# Patient Record
Sex: Male | Born: 1955 | Race: White | Hispanic: No | Marital: Married | Smoking: Former smoker
Health system: Southern US, Community
[De-identification: ages and names within clinical notes are randomized; demographics above are authoritative.]

## PROBLEM LIST (undated history)

## (undated) DIAGNOSIS — I10 Essential (primary) hypertension: Secondary | ICD-10-CM

## (undated) HISTORY — PX: WISDOM TOOTH EXTRACTION: SHX21

## (undated) HISTORY — PX: KNEE ARTHROSCOPY: SHX127

## (undated) HISTORY — DX: Essential (primary) hypertension: I10

---

## 2000-07-30 ENCOUNTER — Ambulatory Visit (HOSPITAL_BASED_OUTPATIENT_CLINIC_OR_DEPARTMENT_OTHER): Admission: RE | Admit: 2000-07-30 | Discharge: 2000-07-30 | Payer: Self-pay | Admitting: *Deleted

## 2001-02-05 ENCOUNTER — Emergency Department (HOSPITAL_COMMUNITY): Admission: EM | Admit: 2001-02-05 | Discharge: 2001-02-05 | Payer: Self-pay | Admitting: Emergency Medicine

## 2003-03-05 ENCOUNTER — Emergency Department (HOSPITAL_COMMUNITY): Admission: EM | Admit: 2003-03-05 | Discharge: 2003-03-06 | Payer: Self-pay | Admitting: Emergency Medicine

## 2004-09-11 ENCOUNTER — Encounter: Admission: RE | Admit: 2004-09-11 | Discharge: 2004-09-11 | Payer: Self-pay | Admitting: Internal Medicine

## 2006-04-08 ENCOUNTER — Ambulatory Visit (HOSPITAL_BASED_OUTPATIENT_CLINIC_OR_DEPARTMENT_OTHER): Admission: RE | Admit: 2006-04-08 | Discharge: 2006-04-08 | Payer: Self-pay | Admitting: Internal Medicine

## 2006-04-10 ENCOUNTER — Ambulatory Visit: Payer: Self-pay | Admitting: Internal Medicine

## 2006-05-13 ENCOUNTER — Ambulatory Visit: Payer: Self-pay | Admitting: Pulmonary Disease

## 2006-06-27 ENCOUNTER — Ambulatory Visit: Payer: Self-pay | Admitting: Pulmonary Disease

## 2011-05-04 ENCOUNTER — Emergency Department (HOSPITAL_COMMUNITY)
Admission: EM | Admit: 2011-05-04 | Discharge: 2011-05-05 | Disposition: A | Discharge status: Home / Self Care | Payer: Managed Care, Other (non HMO) | Attending: Emergency Medicine | Admitting: Emergency Medicine

## 2011-05-04 DIAGNOSIS — W219XXA Striking against or struck by unspecified sports equipment, initial encounter: Secondary | ICD-10-CM | POA: Insufficient documentation

## 2011-05-04 DIAGNOSIS — S01501A Unspecified open wound of lip, initial encounter: Secondary | ICD-10-CM | POA: Insufficient documentation

## 2011-05-04 DIAGNOSIS — I10 Essential (primary) hypertension: Secondary | ICD-10-CM | POA: Insufficient documentation

## 2013-07-17 ENCOUNTER — Ambulatory Visit (INDEPENDENT_AMBULATORY_CARE_PROVIDER_SITE_OTHER): Payer: Managed Care, Other (non HMO)

## 2013-07-17 ENCOUNTER — Encounter: Payer: Self-pay | Admitting: Podiatry

## 2013-07-17 ENCOUNTER — Ambulatory Visit (INDEPENDENT_AMBULATORY_CARE_PROVIDER_SITE_OTHER): Payer: Managed Care, Other (non HMO) | Admitting: Podiatry

## 2013-07-17 VITALS — BP 122/74 | HR 75 | Resp 16 | Ht 72.0 in | Wt 280.0 lb

## 2013-07-17 DIAGNOSIS — M79672 Pain in left foot: Secondary | ICD-10-CM

## 2013-07-17 DIAGNOSIS — M79609 Pain in unspecified limb: Secondary | ICD-10-CM

## 2013-07-17 DIAGNOSIS — M7750 Other enthesopathy of unspecified foot: Secondary | ICD-10-CM

## 2013-07-17 NOTE — Progress Notes (Signed)
  Subjective:    Patient ID: Jeffery Russell, male    DOB: June 13, 1956, 57 y.o.   MRN: [id number]  HPI Comments: "I've got this callus starting I believe from these shoes and also the side of this foot hurts"  Plantar 5th MPJ right and Lateral side right  N - aches L - sub 5th right/lateral right D - 3-4 mos. O - gradual C - callused area, sometimes lateral is swollen, worse, felt a pop last week when stepping A - walking, certain shoes T - epsom salt soaks, advil, changing shoes       Review of Systems  Psychiatric/Behavioral: Positive for sleep disturbance.       Sleep apnea/CPAP  All other systems reviewed and are negative.       Objective:   Physical Exam: I have evaluated his past medical history medications and allergies. Vital signs are stable he is alert and oriented x3. Pulses are strongly palpable bilateral lower extremity. Neurologic sensorium is intact per Semmes-Weinstein monofilament. Deep tendon reflexes are intact bilateral. Muscle strength +5 over 5 dorsiflexors plantar flexors inverters and evertors. Orthopedic evaluation demonstrates pain on palpation of the fourth and fifth metatarsocuboid articulation. There is no pain on medial lateral compression of the calcaneus or palpation of the medial calcaneal tubercle. Radiographic evaluation does not demonstrate any type of osseous abnormalities. Other than mild pes planus.        Assessment & Plan:  Impression: Capsulitis fourth fifth met cuboid articulation left. Pes planus left.  Plan: Discussed the etiology pathology conservative versus surgical therapies. Injected him today with dexamethasone and local anesthetic at the point of maximal tenderness dorsal lateral. Discussed the possible need for orthotics. I will followup with him in one month.

## 2013-08-21 ENCOUNTER — Encounter: Payer: Self-pay | Admitting: Podiatry

## 2013-08-21 ENCOUNTER — Ambulatory Visit (INDEPENDENT_AMBULATORY_CARE_PROVIDER_SITE_OTHER): Payer: Managed Care, Other (non HMO) | Admitting: Podiatry

## 2013-08-21 VITALS — BP 132/83 | HR 74 | Resp 16

## 2013-08-21 DIAGNOSIS — M7750 Other enthesopathy of unspecified foot: Secondary | ICD-10-CM

## 2013-08-21 NOTE — Progress Notes (Signed)
Mr. Loescher presents today for followup of his pain to the fourth intermetatarsal space of the left foot. He states it is approximately 75% better.  Objective: Vital signs are stable he is alert and oriented x3. Pulses are strongly palpable left. Residual tenderness to the proximal fourth intermetatarsal space left foot.  Assessment: Capsulitis metatarsalgia fourth intermetatarsal space left.  Plan: Injected Kenalog and local anesthetic to a today 20 mg point of maximal tenderness. I will followup with him in one month. We'll consider orthotics at that time.

## 2013-09-18 ENCOUNTER — Ambulatory Visit: Payer: Managed Care, Other (non HMO) | Admitting: Podiatry

## 2017-06-06 ENCOUNTER — Other Ambulatory Visit: Payer: Self-pay | Admitting: Internal Medicine

## 2017-06-06 DIAGNOSIS — R2243 Localized swelling, mass and lump, lower limb, bilateral: Secondary | ICD-10-CM

## 2017-06-08 ENCOUNTER — Ambulatory Visit
Admission: RE | Admit: 2017-06-08 | Discharge: 2017-06-08 | Disposition: A | Discharge status: Home / Self Care | Payer: Managed Care, Other (non HMO) | Source: Ambulatory Visit | Attending: Internal Medicine | Admitting: Internal Medicine

## 2017-06-08 ENCOUNTER — Other Ambulatory Visit: Payer: Self-pay | Admitting: Internal Medicine

## 2017-06-08 DIAGNOSIS — R2243 Localized swelling, mass and lump, lower limb, bilateral: Secondary | ICD-10-CM

## 2017-06-08 NOTE — Consult Note (Signed)
Chief Complaint: Patient was seen in consultation today for No chief complaint on file.  at the request of Husain,Karrar  Referring Physician(s): Husain,Karrar  History of Present Illness: Jeffery Russell is a 61 y.o. male with a long history of bilateral ankle edema. He denies any history of skin breakdown. He does stand for long present time during work. He has not had any therapy. He is not on diuretics. He denies visible varicosities. His family history is positive with venous insufficiency from his mother. He denies any lower extremity pain.  Past Medical History:  Diagnosis Date  . Hypertension     Past Surgical History:  Procedure Laterality Date  . KNEE ARTHROSCOPY    . WISDOM TOOTH EXTRACTION      Allergies: Patient has no known allergies.  Medications: Prior to Admission medications   Medication Sig Start Date End Date Taking? Authorizing Provider  lisinopril (PRINIVIL,ZESTRIL) 5 MG tablet Take 5 mg by mouth daily.    [provider]     No family history on file.  Social History   Social History  . Marital status: Married    Spouse name: N/A  . Number of children: N/A  . Years of education: N/A   Social History Main Topics  . Smoking status: Former Research scientist (life sciences)  . Smokeless tobacco: Not on file  . Alcohol use Yes     Comment: occas  . Drug use: Unknown  . Sexual activity: Not on file   Other Topics Concern  . Not on file   Social History Narrative  . No narrative on file      Review of Systems: A 12 point ROS discussed and pertinent positives are indicated in the HPI above.  All other systems are negative.  Review of Systems  Vital Signs: There were no vitals taken for this visit.  Physical Exam  Constitutional: He is oriented to person, place, and time. He appears well-developed and well-nourished.  HENT:  Head: Normocephalic and atraumatic.  Musculoskeletal:  There is no evidence of right lower extremity edema. No evidence  of skin breakdown in the right leg. Pulses are intact distally. Examination of the left lower extremity demonstrates 2+ pitting edema. Pulses are intact distally. No varicosities. No skin breakdown.  Neurological: He is alert and oriented to person, place, and time.     Imaging: US Venous Img Lower Bilateral  Result Date: 06/08/2017 CLINICAL DATA:  Lower extremity edema right greater than left EXAM: BILATERAL LOWER EXTREMITY VENOUS DUPLEX ULTRASOUND TECHNIQUE: Gray-scale sonography with graded compression, as well as color Doppler and duplex ultrasound, were performed to evaluate the deep and superficial veins of both lower extremities. Spectral Doppler was utilized to evaluate flow at rest and with distal augmentation maneuvers. A complete superficial venous insufficiency exam was performed in the upright. I personally performed the technical portion of the exam. COMPARISON:  None. FINDINGS: There is complete compressibility of the bilateral common femoral, femoral, and popliteal veins. Doppler analysis demonstrates respiratory phasicity and augmentation of flow with calf compression. There is no evidence of reflux in the right or left greater or lesser saphenous veins. There is partial thrombosis of the right lesser saphenous vein. IMPRESSION: No evidence of DVT. No evidence of venous insufficiency. Partial superficial vein thrombosis in the right lesser saphenous vein. Electronically Signed   By: Marybelle Killings M.D.   On: 06/08/2017 15:18    Labs:  CBC: No results for input(s): WBC, HGB, HCT, PLT in the last 8760 hours.  COAGS: No results for input(s): INR, APTT in the last 8760 hours.  BMP: No results for input(s): NA, K, CL, CO2, GLUCOSE, BUN, CALCIUM, CREATININE, GFRNONAA, GFRAA in the last 8760 hours.  Invalid input(s): CMP  LIVER FUNCTION TESTS: No results for input(s): BILITOT, AST, ALT, ALKPHOS, PROT, ALBUMIN in the last 8760 hours.  TUMOR MARKERS: No results for input(s): AFPTM,  CEA, CA199, CHROMGRNA in the last 8760 hours.  Assessment and Plan:  Mr. Marsala does have significant unilateral edema at the left ankle but no evidence of venous insufficiency. There is partial thrombosis of the right lesser saphenous vein but this is clinically occult and should resolve spontaneously. Anticoagulation is not recommended for superficial vein thrombosis. Graded compression stockings were administered for his ankle edema.  Thank you for this interesting consult.  I greatly enjoyed meeting TOUSSAINT GOLSON and look forward to participating in their care.  A copy of this report was sent to the requesting provider on this date.  Electronically Signed: Mylon Mabey, ART A 06/08/2017, 3:21 PM   I spent a total of  30 Minutes   in face to face in clinical consultation, greater than 50% of which was counseling/coordinating care for lower extremity edema.

## 2017-12-30 IMAGING — US US EXTREM LOW VENOUS BILAT
1 series · 14 of 24 positions shown · non-contrast
Comparison: None.

CLINICAL DATA: Lower extremity edema right greater than left

EXAM:
BILATERAL LOWER EXTREMITY VENOUS DUPLEX ULTRASOUND
TECHNIQUE: Gray-scale sonography with graded compression, as well as color
Doppler and duplex ultrasound, were performed to evaluate the deep
and superficial veins of both lower extremities. Spectral Doppler
was utilized to evaluate flow at rest and with distal augmentation
maneuvers. A complete superficial venous insufficiency exam was
performed in the upright. I personally performed the technical
portion of the exam.

[Series 1: us extrem low venous bilat · 0.10mm/px · 14 of 52 slices shown]
[im 1/52]
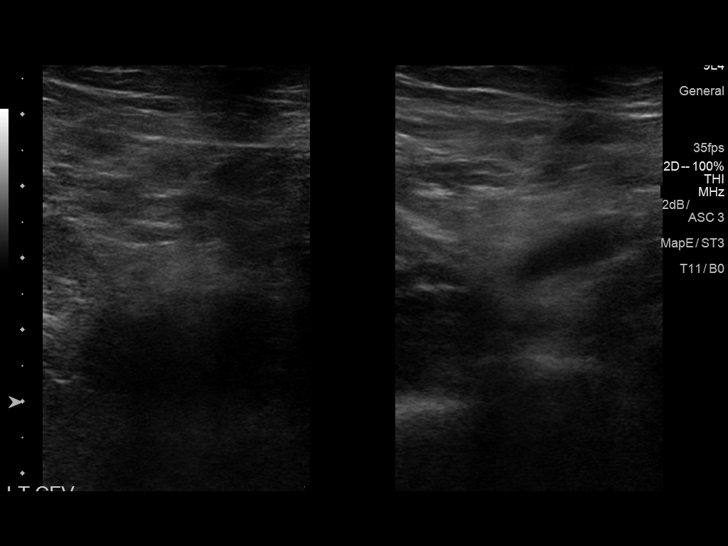
[im 5/52]
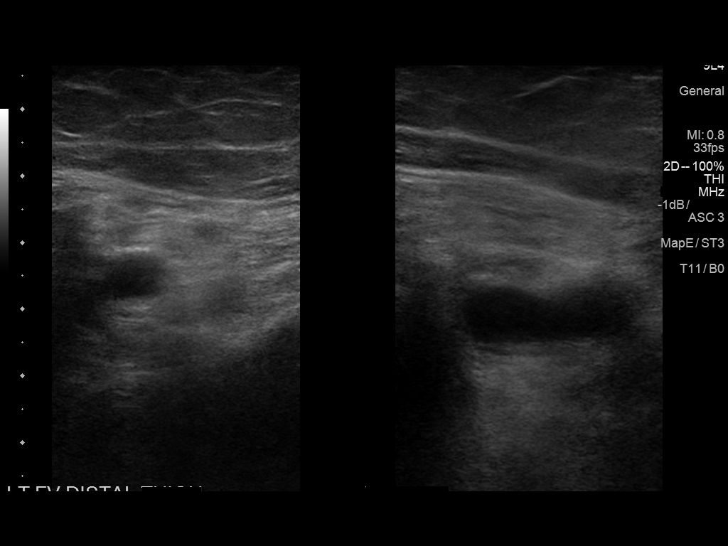
[im 9/52]
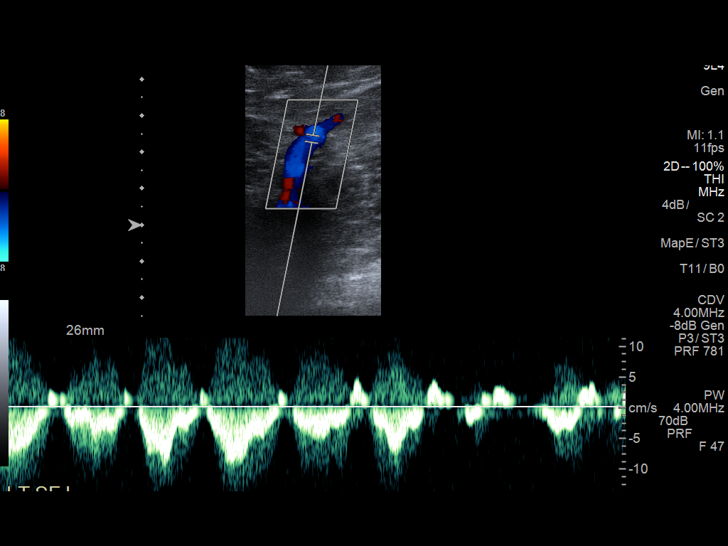
[im 14/52]
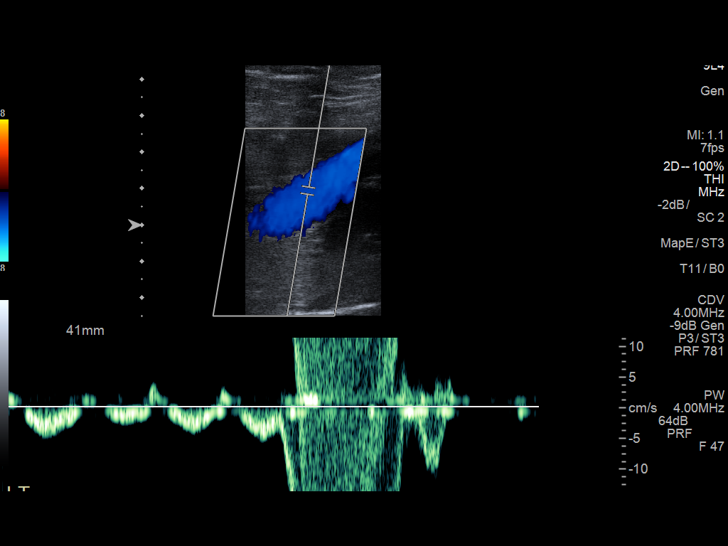
[im 16/52]
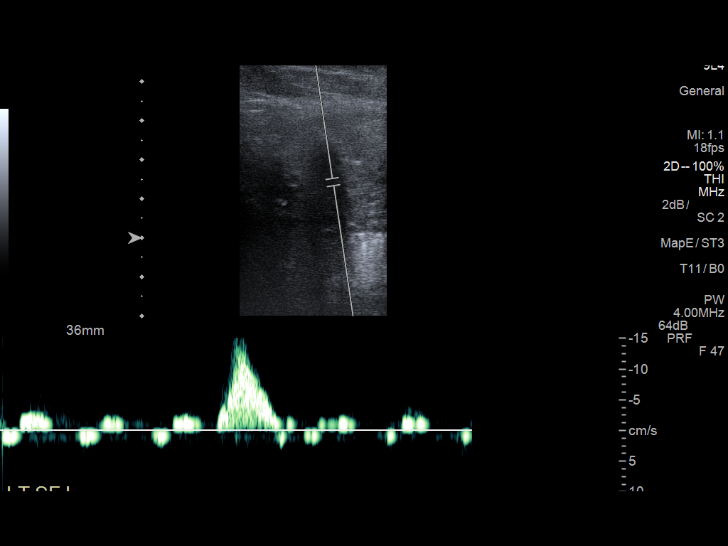
[im 20/52]
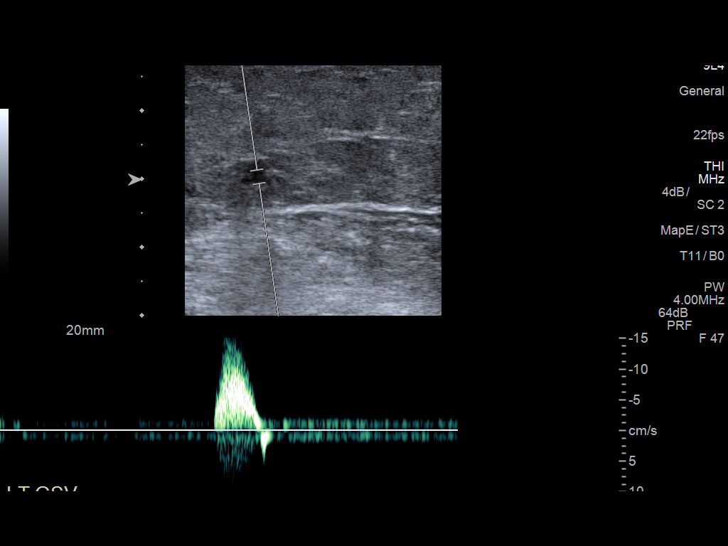
[im 25/52]
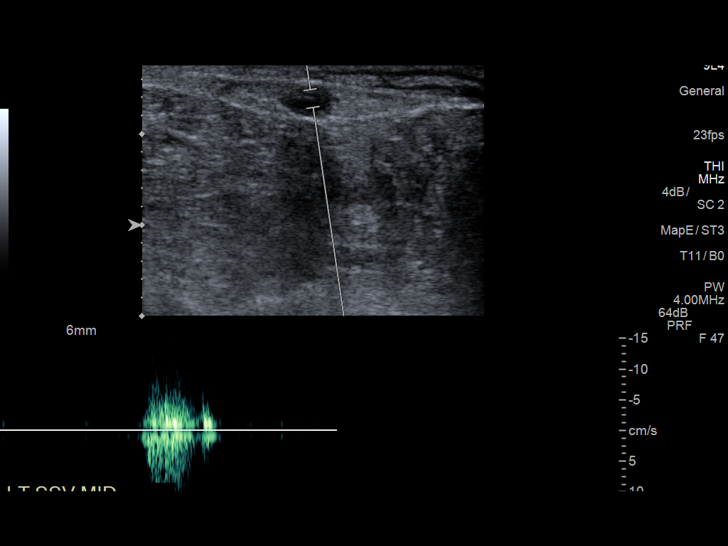
[im 27/52]
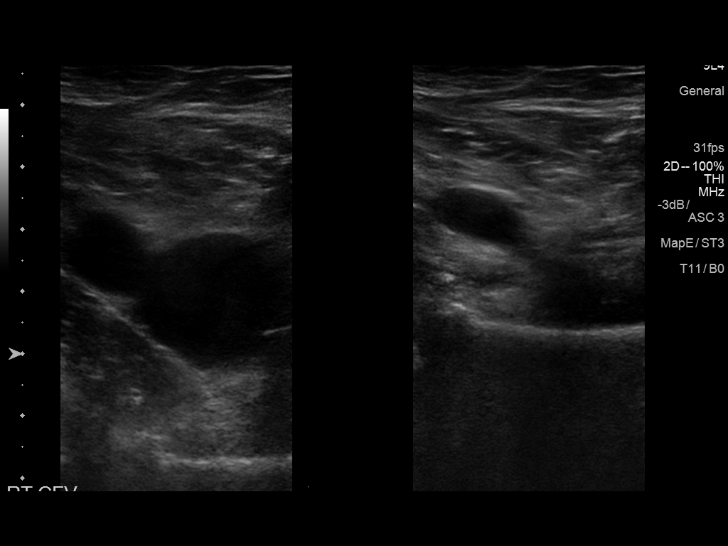
[im 32/52]
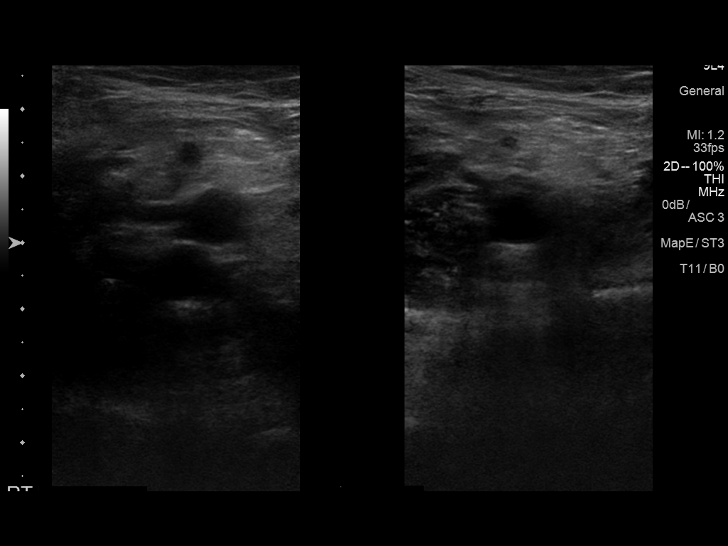
[im 36/52]
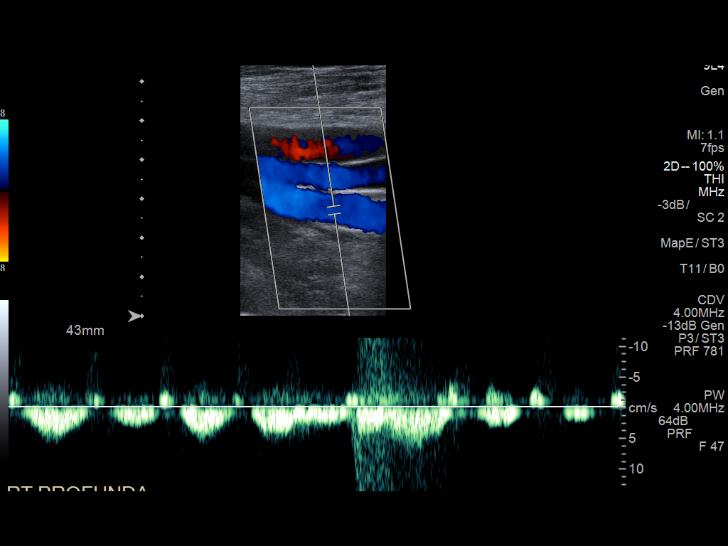
[im 40/52]
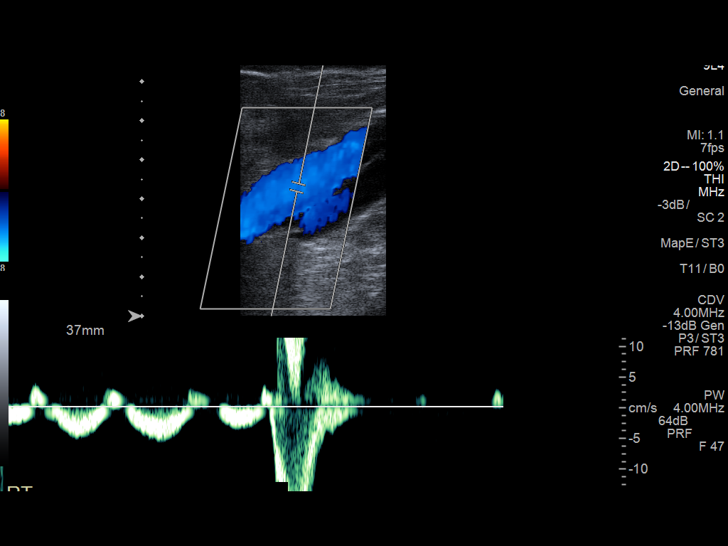
[im 43/52]
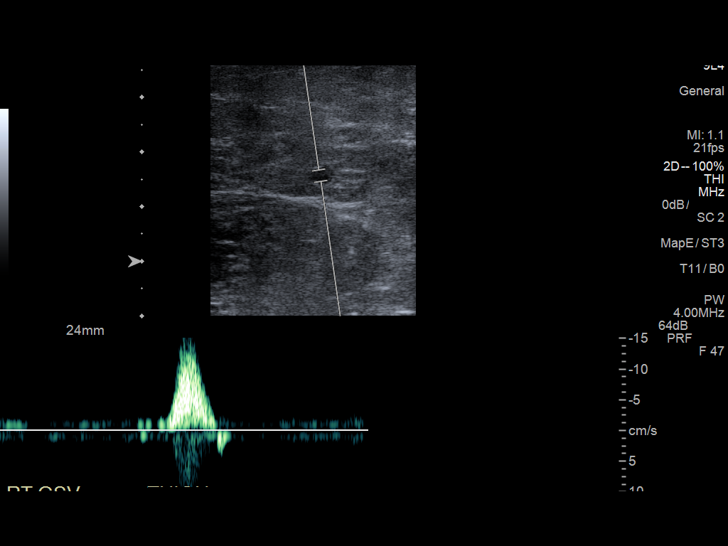
[im 47/52]
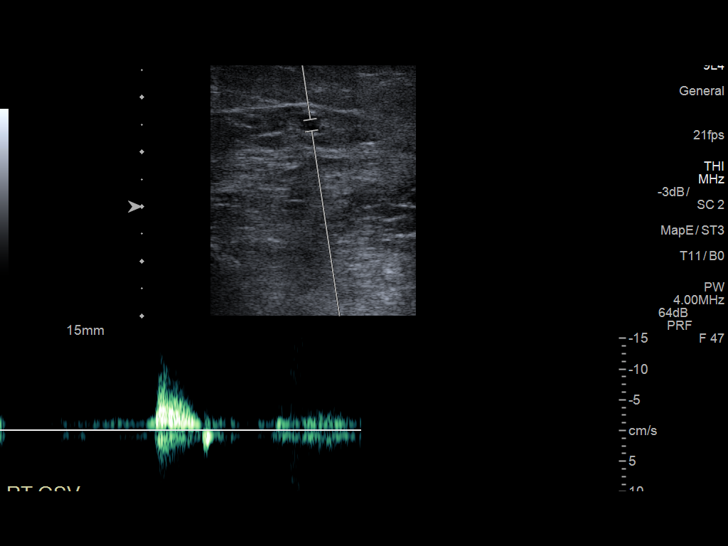
[im 52/52]
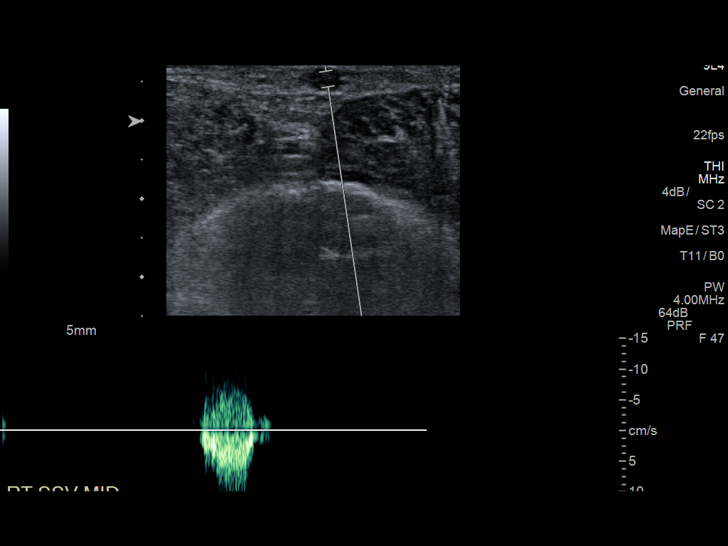

[14 of 24 positions shown; findings below may reference images not displayed]

FINDINGS: There is complete compressibility of the bilateral common femoral,
femoral, and popliteal veins. Doppler analysis demonstrates
respiratory phasicity and augmentation of flow with calf
compression.

There is no evidence of reflux in the right or left greater or
lesser saphenous veins. There is partial thrombosis of the right
lesser saphenous vein.
IMPRESSION: No evidence of DVT.

No evidence of venous insufficiency.

Partial superficial vein thrombosis in the right lesser saphenous
vein.

## 2018-09-26 ENCOUNTER — Ambulatory Visit: Payer: Self-pay | Admitting: Nurse Practitioner

## 2018-09-26 VITALS — BP 125/75 | HR 74 | Temp 98.6°F | Resp 18 | Wt 302.2 lb

## 2018-09-26 DIAGNOSIS — B9789 Other viral agents as the cause of diseases classified elsewhere: Secondary | ICD-10-CM

## 2018-09-26 DIAGNOSIS — J329 Chronic sinusitis, unspecified: Secondary | ICD-10-CM

## 2018-09-26 DIAGNOSIS — J209 Acute bronchitis, unspecified: Secondary | ICD-10-CM

## 2018-09-26 MED ORDER — AZITHROMYCIN 250 MG PO TABS: ORAL_TABLET | ORAL | 0 refills | Status: AC

## 2018-09-26 MED ORDER — IPRATROPIUM BROMIDE 0.06 % NA SOLN: 2.0000 | Freq: Four times a day (QID) | NASAL | 12 refills | Status: AC

## 2018-09-26 MED ORDER — PREDNISONE 10 MG (21) PO TBPK: ORAL_TABLET | ORAL | 0 refills | Status: AC

## 2018-09-26 NOTE — Patient Instructions (Signed)
Acute Bronchitis, Adult -Take medication as prescribed. -Purchase OTC Coricidin HBP cough and cold medicine to help with cough. -Ibuprofen or Tylenol for pain, fever, or general discomfort. -Increase fluids. -Sleep elevated on at least 2 pillows at bedtime to help with cough. -Use a humidifier or vaporizer when at home and during sleep. -May use a teaspoon of honey or over-the-counter cough drops to help with cough. -May use normal saline nasal spray to help with nasal congestion throughout the day. -Follow-up if symptoms do not improve.    Acute bronchitis is sudden (acute) swelling of the air tubes (bronchi) in the lungs. Acute bronchitis causes these tubes to fill with mucus, which can make it hard to breathe. It can also cause coughing or wheezing. In adults, acute bronchitis usually goes away within 2 weeks. A cough caused by bronchitis may last up to 3 weeks. Smoking, allergies, and asthma can make the condition worse. Repeated episodes of bronchitis may cause further lung problems, such as chronic obstructive pulmonary disease (COPD). What are the causes? This condition can be caused by germs and by substances that irritate the lungs, including:  Cold and flu viruses. This condition is most often caused by the same virus that causes a cold.  Bacteria.  Exposure to tobacco smoke, dust, fumes, and air pollution. What increases the risk? This condition is more likely to develop in people who:  Have close contact with someone with acute bronchitis.  Are exposed to lung irritants, such as tobacco smoke, dust, fumes, and vapors.  Have a weak immune system.  Have a respiratory condition such as asthma. What are the signs or symptoms? Symptoms of this condition include:  A cough.  Coughing up clear, yellow, or green mucus.  Wheezing.  Chest congestion.  Shortness of breath.  A fever.  Body aches.  Chills.  A sore throat. How is this diagnosed? This condition is  usually diagnosed with a physical exam. During the exam, your health care provider may order tests, such as chest X-rays, to rule out other conditions. He or she may also:  Test a sample of your mucus for bacterial infection.  Check the level of oxygen in your blood. This is done to check for pneumonia.  Do a chest X-ray or lung function testing to rule out pneumonia and other conditions.  Perform blood tests. Your health care provider will also ask about your symptoms and medical history. How is this treated? Most cases of acute bronchitis clear up over time without treatment. Your health care provider may recommend:  Drinking more fluids. Drinking more makes your mucus thinner, which may make it easier to breathe.  Taking a medicine for a fever or cough.  Taking an antibiotic medicine.  Using an inhaler to help improve shortness of breath and to control a cough.  Using a cool mist vaporizer or humidifier to make it easier to breathe. Follow these instructions at home: Medicines  Take over-the-counter and prescription medicines only as told by your health care provider.  If you were prescribed an antibiotic, take it as told by your health care provider. Do not stop taking the antibiotic even if you start to feel better. General instructions   Get plenty of rest.  Drink enough fluids to keep your urine pale yellow.  Avoid smoking and secondhand smoke. Exposure to cigarette smoke or irritating chemicals will make bronchitis worse. If you smoke and you need help quitting, ask your health care provider. Quitting smoking will help your lungs heal  faster.  Use an inhaler, cool mist vaporizer, or humidifier as told by your health care provider.  Keep all follow-up visits as told by your health care provider. This is important. How is this prevented? To lower your risk of getting this condition again:  Wash your hands often with soap and water. If soap and water are not available,  use hand sanitizer.  Avoid contact with people who have cold symptoms.  Try not to touch your hands to your mouth, nose, or eyes.  Make sure to get the flu shot every year. Contact a health care provider if:  Your symptoms do not improve in 2 weeks of treatment. Get help right away if:  You cough up blood.  You have chest pain.  You have severe shortness of breath.  You become dehydrated.  You faint or keep feeling like you are going to faint.  You keep vomiting.  You have a severe headache.  Your fever or chills gets worse. This information is not intended to replace advice given to you by your health care provider. Make sure you discuss any questions you have with your health care provider. Document Released: 10/14/2004 Document Revised: 04/20/2017 Document Reviewed: 02/25/2016 Elsevier Interactive Patient Education  2019 [employer]  Sinusitis, Adult Sinusitis is inflammation of your sinuses. Sinuses are hollow spaces in the bones around your face. Your sinuses are located:  Around your eyes.  In the middle of your forehead.  Behind your nose.  In your cheekbones. Mucus normally drains out of your sinuses. When your nasal tissues become inflamed or swollen, mucus can become trapped or blocked. This allows bacteria, viruses, and fungi to grow, which leads to infection. Most infections of the sinuses are caused by a virus. Sinusitis can develop quickly. It can last for up to 4 weeks (acute) or for more than 12 weeks (chronic). Sinusitis often develops after a cold. What are the causes? This condition is caused by anything that creates swelling in the sinuses or stops mucus from draining. This includes:  Allergies.  Asthma.  Infection from bacteria or viruses.  Deformities or blockages in your nose or sinuses.  Abnormal growths in the nose (nasal polyps).  Pollutants, such as chemicals or irritants in the air.  Infection from fungi (rare). What increases  the risk? You are more likely to develop this condition if you:  Have a weak body defense system (immune system).  Do a lot of swimming or diving.  Overuse nasal sprays.  Smoke. What are the signs or symptoms? The main symptoms of this condition are pain and a feeling of pressure around the affected sinuses. Other symptoms include:  Stuffy nose or congestion.  Thick drainage from your nose.  Swelling and warmth over the affected sinuses.  Headache.  Upper toothache.  A cough that may get worse at night.  Extra mucus that collects in the throat or the back of the nose (postnasal drip).  Decreased sense of smell and taste.  Fatigue.  A fever.  Sore throat.  Bad breath. How is this diagnosed? This condition is diagnosed based on:  Your symptoms.  Your medical history.  A physical exam.  Tests to find out if your condition is acute or chronic. This may include: ? Checking your nose for nasal polyps. ? Viewing your sinuses using a device that has a light (endoscope). ? Testing for allergies or bacteria. ? Imaging tests, such as an MRI or CT scan. In rare cases, a bone biopsy may  be done to rule out more serious types of fungal sinus disease. How is this treated? Treatment for sinusitis depends on the cause and whether your condition is chronic or acute.  If caused by a virus, your symptoms should go away on their own within 10 days. You may be given medicines to relieve symptoms. They include: ? Medicines that shrink swollen nasal passages (topical intranasal decongestants). ? Medicines that treat allergies (antihistamines). ? A spray that eases inflammation of the nostrils (topical intranasal corticosteroids). ? Rinses that help get rid of thick mucus in your nose (nasal saline washes).  If caused by bacteria, your health care provider may recommend waiting to see if your symptoms improve. Most bacterial infections will get better without antibiotic medicine.  You may be given antibiotics if you have: ? A severe infection. ? A weak immune system.  If caused by narrow nasal passages or nasal polyps, you may need to have surgery. Follow these instructions at home: Medicines  Take, use, or apply over-the-counter and prescription medicines only as told by your health care provider. These may include nasal sprays.  If you were prescribed an antibiotic medicine, take it as told by your health care provider. Do not stop taking the antibiotic even if you start to feel better. Hydrate and humidify   Drink enough fluid to keep your urine pale yellow. Staying hydrated will help to thin your mucus.  Use a cool mist humidifier to keep the humidity level in your home above 50%.  Inhale steam for 10-15 minutes, 3-4 times a day, or as told by your health care provider. You can do this in the bathroom while a hot shower is running.  Limit your exposure to cool or dry air. Rest  Rest as much as possible.  Sleep with your head raised (elevated).  Make sure you get enough sleep each night. General instructions   Apply a warm, moist washcloth to your face 3-4 times a day or as told by your health care provider. This will help with discomfort.  Wash your hands often with soap and water to reduce your exposure to germs. If soap and water are not available, use hand sanitizer.  Do not smoke. Avoid being around people who are smoking (secondhand smoke).  Keep all follow-up visits as told by your health care provider. This is important. Contact a health care provider if:  You have a fever.  Your symptoms get worse.  Your symptoms do not improve within 10 days. Get help right away if:  You have a severe headache.  You have persistent vomiting.  You have severe pain or swelling around your face or eyes.  You have vision problems.  You develop confusion.  Your neck is stiff.  You have trouble breathing. Summary  Sinusitis is soreness and  inflammation of your sinuses. Sinuses are hollow spaces in the bones around your face.  This condition is caused by nasal tissues that become inflamed or swollen. The swelling traps or blocks the flow of mucus. This allows bacteria, viruses, and fungi to grow, which leads to infection.  If you were prescribed an antibiotic medicine, take it as told by your health care provider. Do not stop taking the antibiotic even if you start to feel better.  Keep all follow-up visits as told by your health care provider. This is important. This information is not intended to replace advice given to you by your health care provider. Make sure you discuss any questions you have with  your health care provider. Document Released: 09/06/2005 Document Revised: 02/06/2018 Document Reviewed: 02/06/2018 Elsevier Interactive Patient Education  2019 [employer].

## 2018-09-26 NOTE — Progress Notes (Addendum)
Subjective:  Jeffery Russell is a 63 y.o. male who presents for evaluation of URI like symptoms.  Symptoms include nasal congestion, productive cough with green colored sputum and sinus pressure.  Onset of symptoms was 7 days ago for the cough and sinus symptoms began today.  Patient states cough worsened last pm with increasing coughing and increased sputum production.   Treatment to date:  decongestants and netti pot.  High risk factors for influenza complications:  none.  Patient informed he does have a history of smoking.  Patient states he stopped smoking about 20 years ago.  Patient states he smoked about 20 to 30 years averaging approximately 1 pack/day.  The following portions of the patient's history were reviewed and updated as appropriate:  allergies, current medications and past medical history.  Constitutional: positive for fatigue, negative for anorexia, chills, fevers and malaise Eyes: negative Ears, nose, mouth, throat, and face: positive for nasal congestion, negative for ear drainage, earaches, hoarseness and sore throat Respiratory: positive for cough and sputum, negative for asthma, chronic bronchitis, stridor and wheezing Cardiovascular: negative Gastrointestinal: negative Neurological: positive for headaches, negative for coordination problems, dizziness, gait problems, paresthesia, vertigo and weakness Objective:  BP 125/75 (BP Location: Right Arm, Patient Position: Sitting, Cuff Size: Large)   Pulse 74   Temp 98.6 F (37 C) (Oral)   Resp 18   Wt (!) 302 lb 3.2 oz (137.1 kg)   SpO2 97%   BMI 42.15 kg/m  General appearance: alert, cooperative, fatigued and no distress Head: Normocephalic, without obvious abnormality, atraumatic Eyes: conjunctivae/corneas clear. PERRL, EOM's intact. Fundi benign. Ears: normal TM's and external ear canals both ears Nose: no discharge, moderate congestion, turbinates swollen, inflamed, mild maxillary sinus tenderness right, no frontal  sinus tenderness bilateral Throat: lips, mucosa, and tongue normal; teeth and gums normal Lungs: clear to auscultation bilaterally Heart: regular rate and rhythm, S1, S2 normal, no murmur, click, rub or gallop Abdomen: soft, non-tender; bowel sounds normal; no masses,  no organomegaly Pulses: 2+ and symmetric Skin: Skin color, texture, turgor normal. No rashes or lesions Lymph nodes: cervical and submanidular nodes normal Neurologic: Grossly normal    Assessment:  Acute bronchitis Viral Sinusitis    Plan:   Exam findings, diagnosis etiology and medication use and indications reviewed with patient. Follow- Up and discharge instructions provided. No emergent/urgent issues found on exam.  This in the patient's clinical presentation, symptoms, duration of symptoms, and physical exam, medical ahead and start the patient on a Z-Pak for his cough, along with a steroid Dosepak.  In my opinion there is some concern due to worsening of cough and cough becoming more productive.  I believe patient's sinus symptoms are viral at this time, and will provide symptomatic treatment for his nasal congestion, although the steroids prescribed for his cough may also help.  Patient was also instructed to pick up over-the-counter Coricidin HBP to help with his cough.  We will have the patient follow-up with his PCP if symptoms do not improve with this treatment regimen.  Patient education was provided. Patient verbalized understanding of information provided and agrees with plan of care (POC), all questions answered. The patient is advised to call or return to clinic if condition does not see an improvement in symptoms, or to seek the care of the closest emergency department if condition worsens with the above plan.   1. Acute bronchitis, unspecified organism  - predniSONE (STERAPRED UNI-PAK 21 TAB) 10 MG (21) TBPK tablet; Take as directed.  Dispense: 21 tablet; Refill: 0 - azithromycin (ZITHROMAX) 250 MG tablet;  Take as directed.  Dispense: 6 tablet; Refill: 0 -Take medication as prescribed.-Purchase OTC Coricidin HBP cough and cold medicine to help with cough. -Ibuprofen or Tylenol for pain, fever, or general discomfort. -Increase fluids. -Sleep elevated on at least 2 pillows at bedtime to help with cough. -Use a humidifier or vaporizer when at home and during sleep. -May use a teaspoon of honey or over-the-counter cough drops to help with cough.. -Follow-up if symptoms do not improve.  2. Viral sinusitis  - ipratropium (ATROVENT) 0.06 % nasal spray; Place 2 sprays into both nostrils 4 (four) times daily for 10 days.  Dispense: 15 mL; Refill: 12 -May use normal saline nasal spray to help with nasal congestion throughout the day

## 2022-06-30 DIAGNOSIS — Z0000 Encounter for general adult medical examination without abnormal findings: Secondary | ICD-10-CM | POA: Diagnosis not present

## 2022-06-30 DIAGNOSIS — I872 Venous insufficiency (chronic) (peripheral): Secondary | ICD-10-CM | POA: Diagnosis not present

## 2022-06-30 DIAGNOSIS — E782 Mixed hyperlipidemia: Secondary | ICD-10-CM | POA: Diagnosis not present

## 2022-06-30 DIAGNOSIS — J309 Allergic rhinitis, unspecified: Secondary | ICD-10-CM | POA: Diagnosis not present

## 2022-06-30 DIAGNOSIS — Z1389 Encounter for screening for other disorder: Secondary | ICD-10-CM | POA: Diagnosis not present

## 2022-06-30 DIAGNOSIS — I10 Essential (primary) hypertension: Secondary | ICD-10-CM | POA: Diagnosis not present

## 2022-06-30 DIAGNOSIS — Z23 Encounter for immunization: Secondary | ICD-10-CM | POA: Diagnosis not present

## 2022-08-10 DIAGNOSIS — L821 Other seborrheic keratosis: Secondary | ICD-10-CM | POA: Diagnosis not present

## 2022-08-10 DIAGNOSIS — L570 Actinic keratosis: Secondary | ICD-10-CM | POA: Diagnosis not present

## 2022-08-10 DIAGNOSIS — L814 Other melanin hyperpigmentation: Secondary | ICD-10-CM | POA: Diagnosis not present

## 2022-08-10 DIAGNOSIS — D225 Melanocytic nevi of trunk: Secondary | ICD-10-CM | POA: Diagnosis not present

## 2022-12-28 ENCOUNTER — Ambulatory Visit
Admission: RE | Admit: 2022-12-28 | Discharge: 2022-12-28 | Disposition: A | Discharge status: Home / Self Care | Payer: Medicare Other | Source: Ambulatory Visit | Attending: Sports Medicine | Admitting: Sports Medicine

## 2022-12-28 ENCOUNTER — Other Ambulatory Visit: Payer: Self-pay | Admitting: Sports Medicine

## 2022-12-28 DIAGNOSIS — M25562 Pain in left knee: Secondary | ICD-10-CM

## 2022-12-28 DIAGNOSIS — M1712 Unilateral primary osteoarthritis, left knee: Secondary | ICD-10-CM | POA: Diagnosis not present

## 2022-12-28 DIAGNOSIS — M25462 Effusion, left knee: Secondary | ICD-10-CM | POA: Diagnosis not present

## 2023-01-06 DIAGNOSIS — H0102B Squamous blepharitis left eye, upper and lower eyelids: Secondary | ICD-10-CM | POA: Diagnosis not present

## 2023-01-06 DIAGNOSIS — H16223 Keratoconjunctivitis sicca, not specified as Sjogren's, bilateral: Secondary | ICD-10-CM | POA: Diagnosis not present

## 2023-01-06 DIAGNOSIS — H0288B Meibomian gland dysfunction left eye, upper and lower eyelids: Secondary | ICD-10-CM | POA: Diagnosis not present

## 2023-01-06 DIAGNOSIS — H35371 Puckering of macula, right eye: Secondary | ICD-10-CM | POA: Diagnosis not present

## 2023-01-06 DIAGNOSIS — H35033 Hypertensive retinopathy, bilateral: Secondary | ICD-10-CM | POA: Diagnosis not present

## 2023-01-06 DIAGNOSIS — H47323 Drusen of optic disc, bilateral: Secondary | ICD-10-CM | POA: Diagnosis not present

## 2023-01-06 DIAGNOSIS — H40013 Open angle with borderline findings, low risk, bilateral: Secondary | ICD-10-CM | POA: Diagnosis not present

## 2023-01-06 DIAGNOSIS — H2513 Age-related nuclear cataract, bilateral: Secondary | ICD-10-CM | POA: Diagnosis not present

## 2023-01-06 DIAGNOSIS — H16143 Punctate keratitis, bilateral: Secondary | ICD-10-CM | POA: Diagnosis not present

## 2023-01-06 DIAGNOSIS — H0288A Meibomian gland dysfunction right eye, upper and lower eyelids: Secondary | ICD-10-CM | POA: Diagnosis not present

## 2023-07-20 DIAGNOSIS — E782 Mixed hyperlipidemia: Secondary | ICD-10-CM | POA: Diagnosis not present

## 2023-07-20 DIAGNOSIS — Z1389 Encounter for screening for other disorder: Secondary | ICD-10-CM | POA: Diagnosis not present

## 2023-07-20 DIAGNOSIS — Z0000 Encounter for general adult medical examination without abnormal findings: Secondary | ICD-10-CM | POA: Diagnosis not present

## 2023-07-20 DIAGNOSIS — G4733 Obstructive sleep apnea (adult) (pediatric): Secondary | ICD-10-CM | POA: Diagnosis not present

## 2023-07-20 DIAGNOSIS — I10 Essential (primary) hypertension: Secondary | ICD-10-CM | POA: Diagnosis not present

## 2023-07-20 DIAGNOSIS — I872 Venous insufficiency (chronic) (peripheral): Secondary | ICD-10-CM | POA: Diagnosis not present

## 2023-08-01 DIAGNOSIS — Z860100 Personal history of colon polyps, unspecified: Secondary | ICD-10-CM | POA: Diagnosis not present

## 2023-08-01 DIAGNOSIS — K648 Other hemorrhoids: Secondary | ICD-10-CM | POA: Diagnosis not present

## 2023-08-01 DIAGNOSIS — K5730 Diverticulosis of large intestine without perforation or abscess without bleeding: Secondary | ICD-10-CM | POA: Diagnosis not present

## 2023-08-01 DIAGNOSIS — Z09 Encounter for follow-up examination after completed treatment for conditions other than malignant neoplasm: Secondary | ICD-10-CM | POA: Diagnosis not present

## 2023-08-26 DIAGNOSIS — L821 Other seborrheic keratosis: Secondary | ICD-10-CM | POA: Diagnosis not present

## 2023-08-26 DIAGNOSIS — L814 Other melanin hyperpigmentation: Secondary | ICD-10-CM | POA: Diagnosis not present

## 2023-08-26 DIAGNOSIS — D225 Melanocytic nevi of trunk: Secondary | ICD-10-CM | POA: Diagnosis not present

## 2023-08-26 DIAGNOSIS — D224 Melanocytic nevi of scalp and neck: Secondary | ICD-10-CM | POA: Diagnosis not present

## 2024-01-12 DIAGNOSIS — H40013 Open angle with borderline findings, low risk, bilateral: Secondary | ICD-10-CM | POA: Diagnosis not present

## 2024-01-12 DIAGNOSIS — H47323 Drusen of optic disc, bilateral: Secondary | ICD-10-CM | POA: Diagnosis not present

## 2024-01-12 DIAGNOSIS — H35033 Hypertensive retinopathy, bilateral: Secondary | ICD-10-CM | POA: Diagnosis not present

## 2024-01-12 DIAGNOSIS — H35371 Puckering of macula, right eye: Secondary | ICD-10-CM | POA: Diagnosis not present

## 2024-01-12 DIAGNOSIS — H0288A Meibomian gland dysfunction right eye, upper and lower eyelids: Secondary | ICD-10-CM | POA: Diagnosis not present

## 2024-01-12 DIAGNOSIS — H2513 Age-related nuclear cataract, bilateral: Secondary | ICD-10-CM | POA: Diagnosis not present

## 2024-01-12 DIAGNOSIS — H0288B Meibomian gland dysfunction left eye, upper and lower eyelids: Secondary | ICD-10-CM | POA: Diagnosis not present

## 2024-01-20 DIAGNOSIS — H35371 Puckering of macula, right eye: Secondary | ICD-10-CM | POA: Diagnosis not present

## 2024-01-20 DIAGNOSIS — H43813 Vitreous degeneration, bilateral: Secondary | ICD-10-CM | POA: Diagnosis not present

## 2024-01-20 DIAGNOSIS — H35462 Secondary vitreoretinal degeneration, left eye: Secondary | ICD-10-CM | POA: Diagnosis not present

## 2024-01-20 DIAGNOSIS — H2511 Age-related nuclear cataract, right eye: Secondary | ICD-10-CM | POA: Diagnosis not present

## 2024-03-27 DIAGNOSIS — H35371 Puckering of macula, right eye: Secondary | ICD-10-CM | POA: Diagnosis not present

## 2024-04-13 DIAGNOSIS — H35371 Puckering of macula, right eye: Secondary | ICD-10-CM | POA: Diagnosis not present
